# Patient Record
Sex: Male | Born: 1985 | Race: White | Hispanic: Yes | Marital: Single
Health system: Southern US, Community
[De-identification: ages and names within clinical notes are randomized; demographics above are authoritative.]

---

## 2010-11-16 ENCOUNTER — Emergency Department (HOSPITAL_COMMUNITY)
Admission: EM | Admit: 2010-11-16 | Discharge: 2010-11-16 | Payer: Self-pay | Source: Home / Self Care | Admitting: Emergency Medicine

## 2011-10-14 ENCOUNTER — Emergency Department (HOSPITAL_COMMUNITY)
Admission: EM | Admit: 2011-10-14 | Discharge: 2011-10-14 | Disposition: A | Discharge status: Home / Self Care | Payer: Self-pay | Attending: Emergency Medicine | Admitting: Emergency Medicine

## 2011-10-14 DIAGNOSIS — K1370 Unspecified lesions of oral mucosa: Secondary | ICD-10-CM | POA: Insufficient documentation

## 2011-10-14 DIAGNOSIS — M2669 Other specified disorders of temporomandibular joint: Secondary | ICD-10-CM | POA: Insufficient documentation

## 2011-10-14 DIAGNOSIS — R6884 Jaw pain: Secondary | ICD-10-CM | POA: Insufficient documentation

## 2014-01-11 ENCOUNTER — Ambulatory Visit
Admission: RE | Admit: 2014-01-11 | Discharge: 2014-01-11 | Disposition: A | Discharge status: Home / Self Care | Payer: No Typology Code available for payment source | Source: Ambulatory Visit | Attending: Infectious Disease | Admitting: Infectious Disease

## 2014-01-11 ENCOUNTER — Other Ambulatory Visit: Payer: Self-pay | Admitting: Infectious Disease

## 2014-01-11 DIAGNOSIS — R7611 Nonspecific reaction to tuberculin skin test without active tuberculosis: Secondary | ICD-10-CM

## 2015-02-06 IMAGING — CR DG CHEST 1V
1 series · 1 of 1 positions shown · non-contrast
Comparison: None.

CLINICAL DATA: Positive PPD.

EXAM:
CHEST - 1 VIEW

[view not recorded]
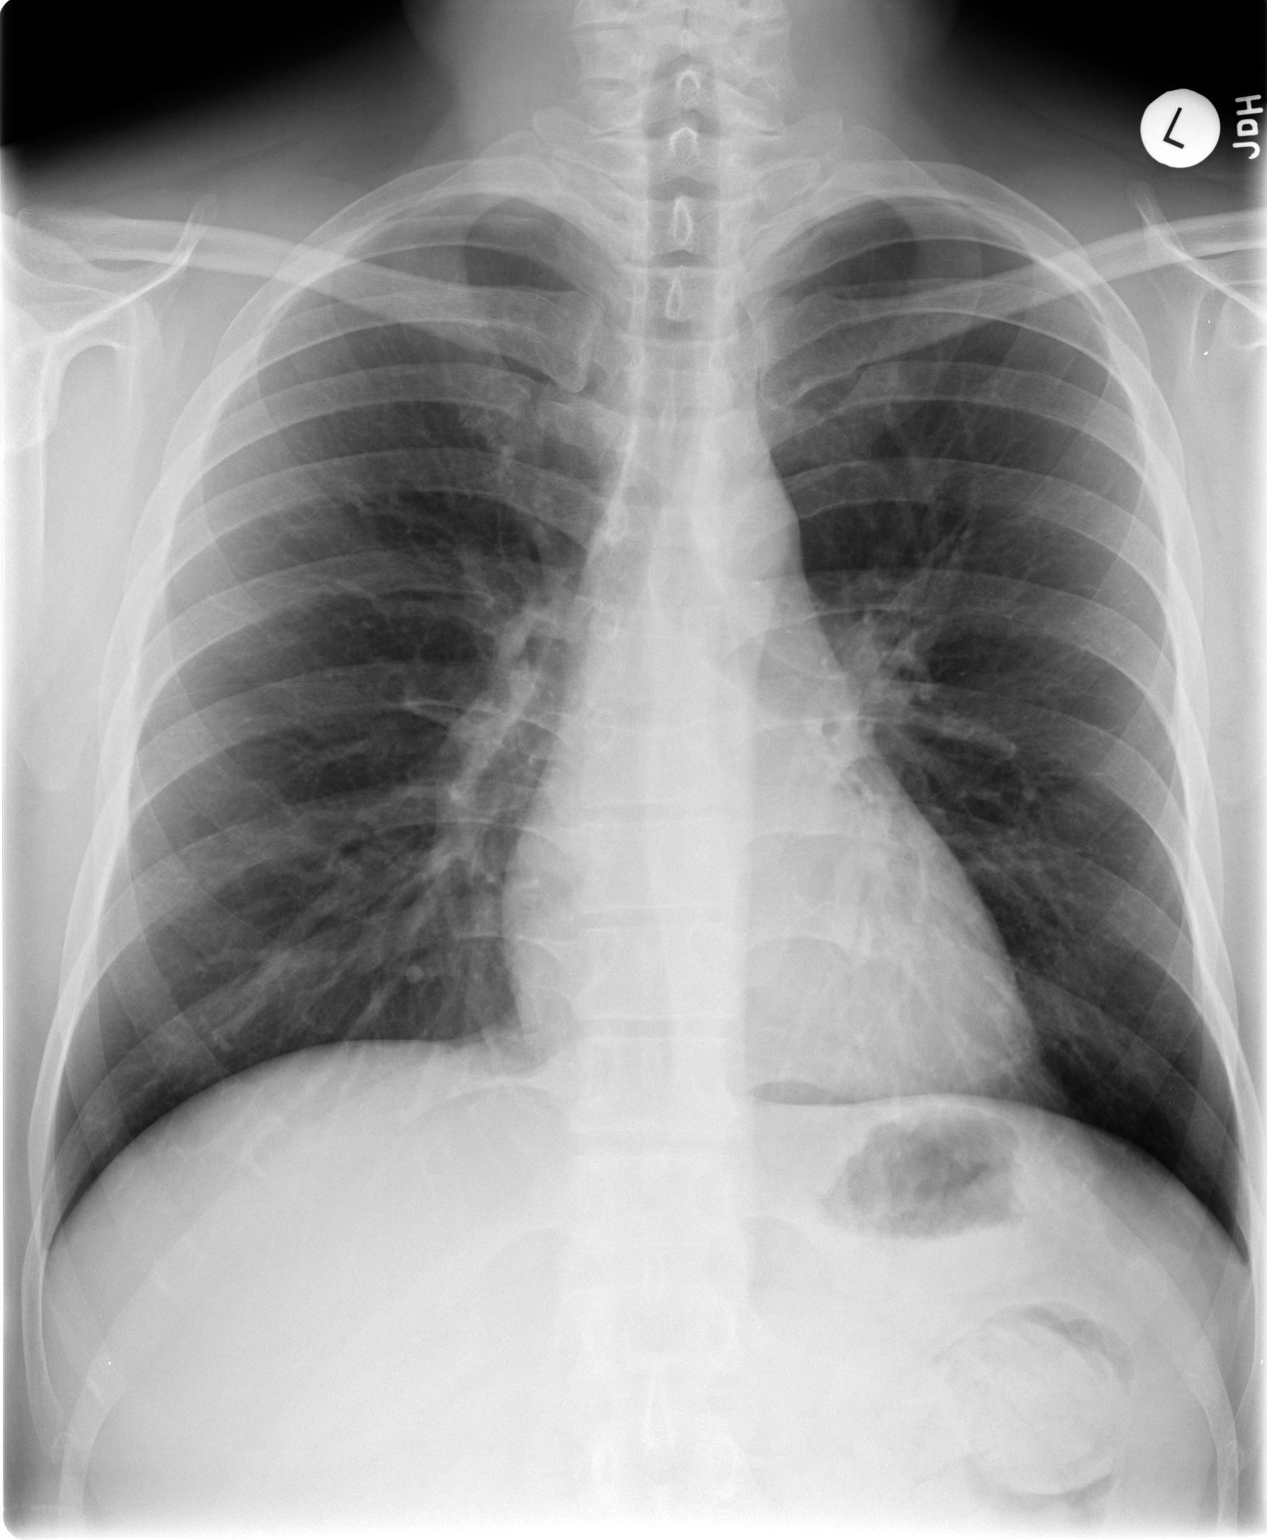

[1 of 1 positions shown; findings below may reference images not displayed]

FINDINGS: The heart size and mediastinal contours are within normal limits.
Both lungs are clear. The visualized skeletal structures are
unremarkable.

There is no evidence for acute or chronic TB.
IMPRESSION: Negative one-view chest.
